# Patient Record
Sex: Female | Born: 1975 | Hispanic: Yes | Marital: Single | Smoking: Never smoker
Health system: Southern US, Community
[De-identification: ages and names within clinical notes are randomized; demographics above are authoritative.]

---

## 2021-07-07 ENCOUNTER — Encounter: Payer: Self-pay | Admitting: Emergency Medicine

## 2021-07-07 ENCOUNTER — Emergency Department: Payer: No Typology Code available for payment source

## 2021-07-07 ENCOUNTER — Other Ambulatory Visit: Payer: Self-pay

## 2021-07-07 ENCOUNTER — Emergency Department
Admission: EM | Admit: 2021-07-07 | Discharge: 2021-07-07 | Disposition: A | Discharge status: Home / Self Care | Payer: No Typology Code available for payment source | Attending: Emergency Medicine | Admitting: Emergency Medicine

## 2021-07-07 DIAGNOSIS — R0789 Other chest pain: Secondary | ICD-10-CM | POA: Insufficient documentation

## 2021-07-07 DIAGNOSIS — R079 Chest pain, unspecified: Secondary | ICD-10-CM

## 2021-07-07 LAB — BASIC METABOLIC PANEL
Anion gap: 8 (ref 5–15)
BUN: 13 mg/dL (ref 6–20)
CO2: 22 mmol/L (ref 22–32)
Calcium: 9.4 mg/dL (ref 8.9–10.3)
Chloride: 103 mmol/L (ref 98–111)
Creatinine, Ser: 0.37 mg/dL — ABNORMAL LOW (ref 0.44–1.00)
GFR, Estimated: >60 mL/min (ref >60)
Glucose, Bld: 99 mg/dL (ref 70–99)
Potassium: 3.7 mmol/L (ref 3.5–5.1)
Sodium: 133 mmol/L — ABNORMAL LOW (ref 135–145)

## 2021-07-07 LAB — CBC
HCT: 34.9 % — ABNORMAL LOW (ref 36.0–46.0)
Hemoglobin: 11.8 g/dL — ABNORMAL LOW (ref 12.0–15.0)
MCH: 29.9 pg (ref 26.0–34.0)
MCHC: 33.8 g/dL (ref 30.0–36.0)
MCV: 88.4 fL (ref 80.0–100.0)
Platelets: 244 10*3/uL (ref 150–400)
RBC: 3.95 MIL/uL (ref 3.87–5.11)
RDW: 14.1 % (ref 11.5–15.5)
WBC: 7.8 10*3/uL (ref 4.0–10.5)
nRBC: 0.3 % — ABNORMAL HIGH (ref 0.0–0.2)

## 2021-07-07 LAB — TROPONIN I (HIGH SENSITIVITY)
Troponin I (High Sensitivity): <2 ng/L (ref <18)
Troponin I (High Sensitivity): <2 ng/L (ref <18)

## 2021-07-07 NOTE — ED Triage Notes (Signed)
Pt comes into the ED via [facility] from [facility]CP c/o chest pressure that radiates Into the left arm.  Pt states today it started while they were getting ready to go shopping.  Pt denies any SHOB, dizziness, or N/V.  Initial pain was 10/10 but pain has since then resolved.  12 lead unremarkable.    20g R Hand 324 ASA given 112/86 85 HR 100% RA CBG 125

## 2021-07-07 NOTE — Discharge Instructions (Addendum)
-  Follow up with your established PCP as discussed.  -Please return to the ED if your symptoms return.

## 2021-07-07 NOTE — ED Provider Notes (Signed)
[facility] Emergency Department Provider Note    ____________________________________________   None    (approximate)  I have reviewed the triage vital signs and the nursing notes.   HISTORY  Chief Complaint Chest Pain   HPI Lori Salinas is a 45 y.o. female, no reported medical history, presents to the emergency department for evaluation of chest pain. Pt states that she was getting ready to go shopping when she felt a sudden onset of chest pressure radiating into the left arm.  Initial pain was 10/10.  She was brought in by [facility] from her [facility] PCP.  After arriving to the emergency department her pain has since resolved.  Denies shortness of breath, chest discomfort, nausea/vomiting, dizziness, abdominal pain, or back pain.  History limited by: No limitations.  History reviewed. No pertinent past medical history.  There are no problems to display for this patient.   History reviewed. No pertinent surgical history.  Prior to Admission medications   Not on File    Allergies Patient has no known allergies.  History reviewed. No pertinent family history.  Social History Social History   Tobacco Use   Smoking status: Never   Smokeless tobacco: Never  Substance Use Topics   Alcohol use: Not Currently   Drug use: Not Currently    Review of Systems  Constitutional: Negative for fever/chills, weight loss, or fatigue.  Eyes: Negative for visual changes or discharge.  ENT: Negative for congestion, hearing changes, or sore throat.  Gastrointestinal: Negative for abdominal pain, nausea/vomiting, or diarrhea.  Genitourinary: Negative for dysuria or hematuria.  Musculoskeletal: Negative for back pain or joint pain.  Skin: Negative for rashes or lesions.  Neurological: Negative for headache, syncope, dizziness, tremors, or numbness/tingling.   10-point ROS otherwise negative. ____________________________________________   PHYSICAL  EXAM:  VITAL SIGNS: ED Triage Vitals  Enc Vitals Group     BP 07/07/21 1333 109/76     Pulse Rate 07/07/21 1333 72     Resp 07/07/21 1333 18     Temp 07/07/21 1333 98.3 F (36.8 C)     Temp Source 07/07/21 1333 Oral     SpO2 07/07/21 1333 97 %     Weight 07/07/21 1329 112 lb (50.8 kg)     Height 07/07/21 1329 4\' 11"  (1.499 m)     Head Circumference --      Peak Flow --      Pain Score 07/07/21 1324 0     Pain Loc --      Pain Edu? --      Excl. in [place]? --     Physical Exam Constitutional:      General: She is not in acute distress.    Appearance: She is well-developed.  HENT:     Head: Normocephalic and atraumatic.     Nose: Nose normal.     Mouth/Throat:     Mouth: Mucous membranes are moist.     Pharynx: Oropharynx is clear.  Eyes:     Extraocular Movements: Extraocular movements intact.     Pupils: Pupils are equal, round, and reactive to light.  Cardiovascular:     Rate and Rhythm: Normal rate and regular rhythm.     Pulses: Normal pulses.     Heart sounds: Normal heart sounds. No murmur heard.   No friction rub. No gallop.  Pulmonary:     Effort: Pulmonary effort is normal.     Breath sounds: Normal breath sounds.  Abdominal:  General: Abdomen is flat.     Palpations: Abdomen is soft.  Musculoskeletal:        General: Normal range of motion.  Skin:    General: Skin is warm and dry.  Neurological:     Mental Status: She is alert and oriented to person, place, and time.  Psychiatric:        Mood and Affect: Mood normal.        Behavior: Behavior normal.        Thought Content: Thought content normal.        Judgment: Judgment normal.     ____________________________________________    LABS  (all labs ordered are listed, but only abnormal results are displayed)  Labs Reviewed  BASIC METABOLIC PANEL - Abnormal; Notable for the following components:      Result Value   Sodium 133 (*)    Creatinine, Ser 0.37 (*)    All other components within  normal limits  CBC - Abnormal; Notable for the following components:   Hemoglobin 11.8 (*)    HCT 34.9 (*)    nRBC 0.3 (*)    All other components within normal limits  TROPONIN I (HIGH SENSITIVITY)  TROPONIN I (HIGH SENSITIVITY)     ____________________________________________   EKG Sinus rhythm, rate 71, no ST segment changes, no T wave inversions, no axis deviations, no AV blocks.    ____________________________________________    RADIOLOGY I personally viewed and evaluated these images as part of my medical decision making, as well as reviewing the written report by the radiologist.  ED Provider Interpretation: I agree with the interpretation of the radiologist.  No active cardiopulmonary disease.  DG Chest 2 View  Result Date: 07/07/2021 CLINICAL DATA:  Chest pain EXAM: CHEST - 2 VIEW COMPARISON:  None. FINDINGS: The heart size and mediastinal contours are within normal limits. Both lungs are clear. The visualized skeletal structures are unremarkable. IMPRESSION: No active cardiopulmonary disease. Electronically Signed   By: Duanne Guess D.O.   On: 07/07/2021 14:34    ____________________________________________   PROCEDURES  Procedures   Medications - No data to display  Critical Care performed: No  ____________________________________________   INITIAL IMPRESSION / ASSESSMENT AND PLAN / ED COURSE  Pertinent labs & imaging results that were available during my care of the patient were reviewed by me and considered in my medical decision making (see chart for details).       Lori Salinas is a 45 y.o. female, no reported medical history, presents to the emergency department for evaluation of chest pain. Pt states that she was getting ready to go shopping when she felt a sudden onset of chest pressure radiating into the left arm.  Initial pain was 10/10.  She was brought in by [facility] from her [facility] PCP.  After arriving to the emergency department her  pain has since resolved.  Denies shortness of breath, chest discomfort, nausea/vomiting, dizziness, abdominal pain, or back pain.  Pt appears well. NAD. Physical exam is unremarkable. Initial lab work up is unremarkable. CBC shows no leukocytosis or anemia. BMP unremarkable for AKI or electrolyte deficiencies. Troponin < 2 (initial and recollection).  CXR negative for active cardiopulmonary disease.  Vital signs are stable at this time.  She is currently requesting to be discharged at this time.  Given her unremarkable physical exam, EKG, and lab work-up, I do not suspect any life-threatening pathology at this time.  Her symptoms have completely resolved.  I believe she is safe for  discharge at this time with primary care follow-up.  I encouraged the patient to return to the emergency department if her symptoms return or if she begins to experience any new symptoms.  Will discharge with anticipatory guidance, return precautions, and instructions for follow-up with her primary care provider.      ____________________________________________   FINAL CLINICAL IMPRESSION(S) / ED DIAGNOSES  Final diagnoses:  Chest pain, unspecified type     NEW MEDICATIONS STARTED DURING THIS VISIT:  ED Discharge Orders     None        Note:  This document was prepared using Dragon voice recognition software and may include unintentional dictation errors.    Teodoro Spray, [place] 07/08/21 1023    Blake Divine, MD 07/10/21 [phone]

## 2022-11-09 IMAGING — CR DG CHEST 2V
2 series · 2 of 2 positions shown · non-contrast
Comparison: None.

CLINICAL DATA: Chest pain

EXAM:
CHEST - 2 VIEW

[chest pa]
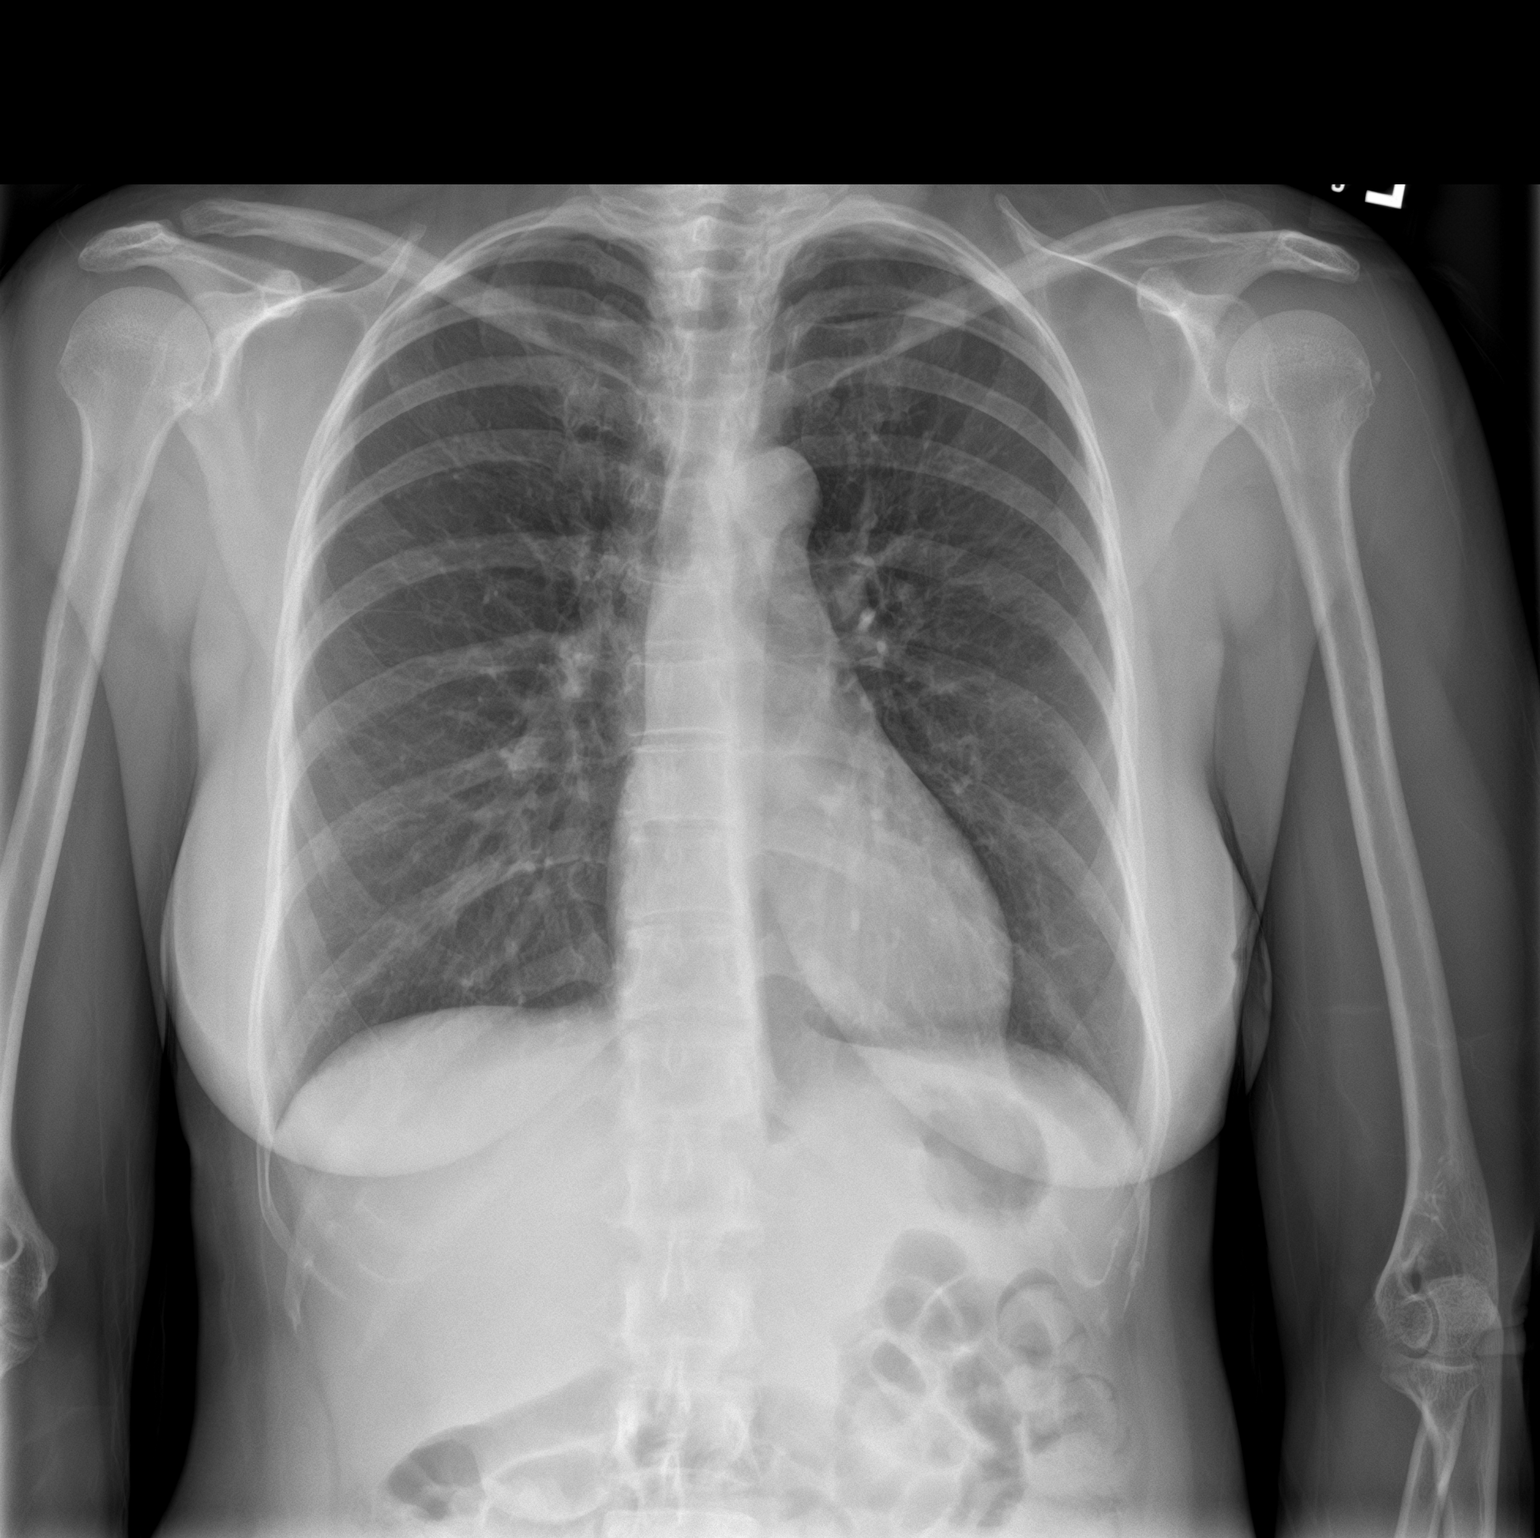

[chest lat]
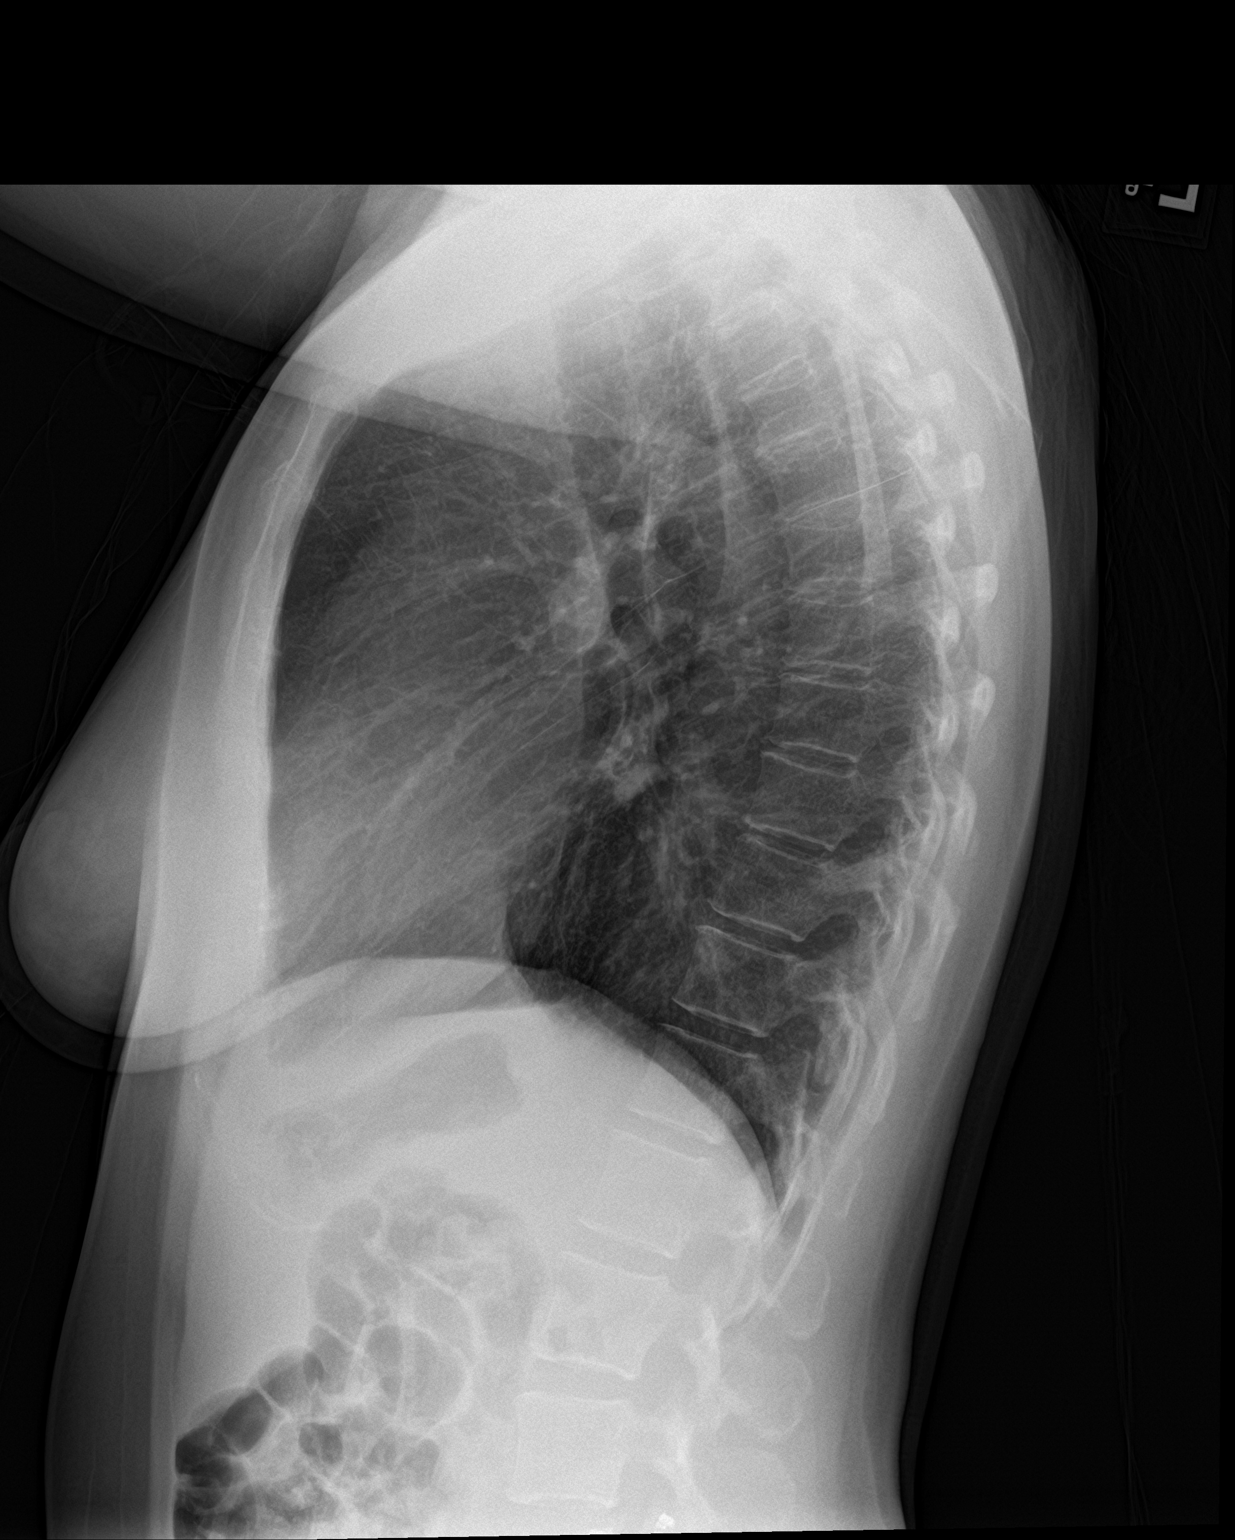

[2 of 2 positions shown; findings below may reference images not displayed]

FINDINGS: The heart size and mediastinal contours are within normal limits.
Both lungs are clear. The visualized skeletal structures are
unremarkable.
IMPRESSION: No active cardiopulmonary disease.
